# Patient Record
Sex: Female | Born: 1935 | Race: Black or African American | Hispanic: No | Marital: Single
Health system: Southern US, Community
[De-identification: ages and names within clinical notes are randomized; demographics above are authoritative.]

---

## 2004-10-08 ENCOUNTER — Ambulatory Visit: Payer: Self-pay | Admitting: Family Medicine

## 2005-07-12 ENCOUNTER — Other Ambulatory Visit: Payer: Self-pay

## 2005-07-12 ENCOUNTER — Inpatient Hospital Stay: Payer: Self-pay | Admitting: Internal Medicine

## 2005-10-21 ENCOUNTER — Ambulatory Visit: Payer: Self-pay | Admitting: Nurse Practitioner

## 2006-10-27 ENCOUNTER — Ambulatory Visit: Payer: Self-pay | Admitting: Family Medicine

## 2006-11-09 ENCOUNTER — Ambulatory Visit: Payer: Self-pay | Admitting: Family Medicine

## 2006-11-10 ENCOUNTER — Ambulatory Visit: Payer: Self-pay | Admitting: Family Medicine

## 2008-01-09 ENCOUNTER — Ambulatory Visit: Payer: Self-pay | Admitting: Family Medicine

## 2008-07-20 ENCOUNTER — Emergency Department: Payer: Self-pay | Admitting: Emergency Medicine

## 2009-01-17 ENCOUNTER — Ambulatory Visit: Payer: Self-pay | Admitting: Family Medicine

## 2009-01-29 ENCOUNTER — Ambulatory Visit: Payer: Self-pay | Admitting: Family Medicine

## 2009-02-21 ENCOUNTER — Emergency Department: Payer: Self-pay | Admitting: Emergency Medicine

## 2010-01-08 ENCOUNTER — Inpatient Hospital Stay: Payer: Self-pay | Admitting: General Practice

## 2010-01-15 ENCOUNTER — Encounter: Payer: Self-pay | Admitting: Internal Medicine

## 2010-02-11 ENCOUNTER — Encounter: Payer: Self-pay | Admitting: Internal Medicine

## 2012-09-11 ENCOUNTER — Emergency Department: Payer: Self-pay | Admitting: Emergency Medicine

## 2012-09-11 LAB — URINALYSIS, COMPLETE
Bilirubin,UR: NEGATIVE
Blood: NEGATIVE
Glucose,UR: NEGATIVE mg/dL (ref 0–75)
Ketone: NEGATIVE
Ph: 6 (ref 4.5–8.0)
WBC UR: 1 /HPF (ref 0–5)

## 2012-09-11 LAB — CBC
HCT: 38.3 % (ref 35.0–47.0)
HGB: 12.8 g/dL (ref 12.0–16.0)
MCH: 30.4 pg (ref 26.0–34.0)
MCV: 91 fL (ref 80–100)
RDW: 13.4 % (ref 11.5–14.5)
WBC: 7.9 10*3/uL (ref 3.6–11.0)

## 2012-09-11 LAB — COMPREHENSIVE METABOLIC PANEL
Alkaline Phosphatase: 107 U/L (ref 50–136)
Anion Gap: 5 — ABNORMAL LOW (ref 7–16)
Bilirubin,Total: 0.6 mg/dL (ref 0.2–1.0)
Calcium, Total: 9.5 mg/dL (ref 8.5–10.1)
Co2: 27 mmol/L (ref 21–32)
EGFR (African American): 46 — ABNORMAL LOW
EGFR (Non-African Amer.): 39 — ABNORMAL LOW
Osmolality: 275 (ref 275–301)
Potassium: 4.5 mmol/L (ref 3.5–5.1)
SGOT(AST): 19 U/L (ref 15–37)
Total Protein: 7.8 g/dL (ref 6.4–8.2)

## 2012-12-27 ENCOUNTER — Ambulatory Visit: Payer: Self-pay | Admitting: General Surgery

## 2012-12-27 ENCOUNTER — Encounter: Payer: Self-pay | Admitting: Surgery

## 2013-01-01 LAB — WOUND AEROBIC CULTURE

## 2013-01-02 ENCOUNTER — Encounter: Payer: Self-pay | Admitting: Surgery

## 2013-01-11 ENCOUNTER — Encounter: Payer: Self-pay | Admitting: Surgery

## 2013-02-11 ENCOUNTER — Encounter: Payer: Self-pay | Admitting: Surgery

## 2013-03-23 ENCOUNTER — Encounter: Payer: Self-pay | Admitting: Surgery

## 2013-03-31 LAB — WOUND AEROBIC CULTURE

## 2013-04-11 ENCOUNTER — Encounter: Payer: Self-pay | Admitting: Surgery

## 2013-05-11 ENCOUNTER — Encounter: Payer: Self-pay | Admitting: Surgery

## 2013-06-19 ENCOUNTER — Encounter: Payer: Self-pay | Admitting: Surgery

## 2013-07-10 LAB — URINALYSIS, COMPLETE
BACTERIA: NONE SEEN
BLOOD: NEGATIVE
Bilirubin,UR: NEGATIVE
Glucose,UR: NEGATIVE mg/dL (ref 0–75)
Hyaline Cast: 5
LEUKOCYTE ESTERASE: NEGATIVE
NITRITE: NEGATIVE
PH: 5 (ref 4.5–8.0)
Protein: NEGATIVE
RBC,UR: 1 /HPF (ref 0–5)
SPECIFIC GRAVITY: 1.021 (ref 1.003–1.030)
Squamous Epithelial: <1
WBC UR: 1 /HPF (ref 0–5)

## 2013-07-10 LAB — HEPATIC FUNCTION PANEL A (ARMC)
ALBUMIN: 3.5 g/dL (ref 3.4–5.0)
ALT: 12 U/L (ref 12–78)
Alkaline Phosphatase: 105 U/L
BILIRUBIN DIRECT: 0.1 mg/dL (ref 0.00–0.20)
Bilirubin,Total: 0.6 mg/dL (ref 0.2–1.0)
SGOT(AST): 12 U/L — ABNORMAL LOW (ref 15–37)
Total Protein: 8.3 g/dL — ABNORMAL HIGH (ref 6.4–8.2)

## 2013-07-10 LAB — CBC
HCT: 40.8 % (ref 35.0–47.0)
HGB: 13 g/dL (ref 12.0–16.0)
MCH: 28.6 pg (ref 26.0–34.0)
MCHC: 31.9 g/dL — AB (ref 32.0–36.0)
MCV: 90 fL (ref 80–100)
PLATELETS: 300 10*3/uL (ref 150–440)
RBC: 4.54 10*6/uL (ref 3.80–5.20)
RDW: 14.2 % (ref 11.5–14.5)
WBC: 7.7 10*3/uL (ref 3.6–11.0)

## 2013-07-10 LAB — BASIC METABOLIC PANEL
Anion Gap: 10 (ref 7–16)
BUN: 14 mg/dL (ref 7–18)
CO2: 26 mmol/L (ref 21–32)
Calcium, Total: 9.8 mg/dL (ref 8.5–10.1)
Chloride: 103 mmol/L (ref 98–107)
Creatinine: 1.1 mg/dL (ref 0.60–1.30)
EGFR (African American): 54 — ABNORMAL LOW
GFR CALC NON AF AMER: 46 — AB
GLUCOSE: 109 mg/dL — AB (ref 65–99)
OSMOLALITY: 279 (ref 275–301)
Potassium: 4.5 mmol/L (ref 3.5–5.1)
SODIUM: 139 mmol/L (ref 136–145)

## 2013-07-10 LAB — LIPASE, BLOOD: Lipase: 145 U/L (ref 73–393)

## 2013-07-10 LAB — MAGNESIUM: MAGNESIUM: 1.8 mg/dL

## 2013-07-10 LAB — TROPONIN I: Troponin-I: <0.02 ng/mL

## 2013-07-11 ENCOUNTER — Observation Stay: Payer: Self-pay | Admitting: Internal Medicine

## 2013-07-12 LAB — CBC WITH DIFFERENTIAL/PLATELET
BASOS PCT: 1 %
Basophil #: 0.1 10*3/uL (ref 0.0–0.1)
EOS ABS: 0.2 10*3/uL (ref 0.0–0.7)
EOS PCT: 3 %
HCT: 36.6 % (ref 35.0–47.0)
HGB: 12.2 g/dL (ref 12.0–16.0)
LYMPHS PCT: 19.2 %
Lymphocyte #: 1.4 10*3/uL (ref 1.0–3.6)
MCH: 29.7 pg (ref 26.0–34.0)
MCHC: 33.2 g/dL (ref 32.0–36.0)
MCV: 89 fL (ref 80–100)
MONO ABS: 0.6 x10 3/mm (ref 0.2–0.9)
MONOS PCT: 8.6 %
NEUTROS ABS: 4.9 10*3/uL (ref 1.4–6.5)
Neutrophil %: 68.2 %
Platelet: 280 10*3/uL (ref 150–440)
RBC: 4.1 10*6/uL (ref 3.80–5.20)
RDW: 13.9 % (ref 11.5–14.5)
WBC: 7.2 10*3/uL (ref 3.6–11.0)

## 2013-07-12 LAB — BASIC METABOLIC PANEL
Anion Gap: 7 (ref 7–16)
BUN: 12 mg/dL (ref 7–18)
Calcium, Total: 9.4 mg/dL (ref 8.5–10.1)
Chloride: 106 mmol/L (ref 98–107)
Co2: 25 mmol/L (ref 21–32)
Creatinine: 1.03 mg/dL (ref 0.60–1.30)
EGFR (African American): 58 — ABNORMAL LOW
EGFR (Non-African Amer.): 50 — ABNORMAL LOW
GLUCOSE: 114 mg/dL — AB (ref 65–99)
Osmolality: 276 (ref 275–301)
Potassium: 4.3 mmol/L (ref 3.5–5.1)
Sodium: 138 mmol/L (ref 136–145)

## 2013-07-16 LAB — CEA: CEA: 1.1 ng/mL (ref 0.0–4.7)

## 2013-07-16 LAB — CANCER ANTIGEN 27.29: CA 27.29: 32.4 U/mL (ref 0.0–38.6)

## 2013-07-17 ENCOUNTER — Ambulatory Visit: Payer: Self-pay | Admitting: Internal Medicine

## 2013-07-17 LAB — PROTIME-INR
INR: 1.1
Prothrombin Time: 13.8 secs (ref 11.5–14.7)

## 2013-07-17 LAB — CREATININE, SERUM
Creatinine: 1.22 mg/dL (ref 0.60–1.30)
GFR CALC AF AMER: 47 — AB
GFR CALC NON AF AMER: 41 — AB

## 2013-07-18 LAB — CA 125: CA 125: 2049 U/mL — ABNORMAL HIGH (ref 0.0–34.0)

## 2013-07-18 LAB — CANCER ANTIGEN 19-9: CA 19-9: <1 U/mL (ref 0–35)

## 2013-07-23 LAB — BASIC METABOLIC PANEL
Anion Gap: 6 — ABNORMAL LOW (ref 7–16)
BUN: 13 mg/dL (ref 7–18)
CHLORIDE: 103 mmol/L (ref 98–107)
CREATININE: 1.36 mg/dL — AB (ref 0.60–1.30)
Calcium, Total: 9.7 mg/dL (ref 8.5–10.1)
Co2: 26 mmol/L (ref 21–32)
EGFR (African American): 42 — ABNORMAL LOW
EGFR (Non-African Amer.): 36 — ABNORMAL LOW
Glucose: 159 mg/dL — ABNORMAL HIGH (ref 65–99)
Osmolality: 274 (ref 275–301)
POTASSIUM: 4.6 mmol/L (ref 3.5–5.1)
Sodium: 135 mmol/L — ABNORMAL LOW (ref 136–145)

## 2013-07-23 LAB — APTT: ACTIVATED PTT: 27.5 s (ref 23.6–35.9)

## 2013-08-11 ENCOUNTER — Ambulatory Visit: Payer: Self-pay | Admitting: Internal Medicine

## 2013-09-11 DEATH — deceased

## 2014-05-04 NOTE — Consult Note (Signed)
Brief Consult Note: Diagnosis: metastatic cancer unknown primary site.   Comments: patient seen, chart reviewed, finding of sclerotic metastasis , no lung mass, small nonspecific pulm nodules, agree that breast or metastatic renal cancer most consistent with this presentation. Will check first u/s abdomen, if no obvious primary site, or if no obvious liver metastasis for bx, would obtain additional imaging. Will check also cea and ca 27/29 tumor markers. Dictated note to follow.  Electronic Signatures: Marin RobertsGittin, Luisana Lutzke G (MD)  (Signed 01-Jul-15 19:53)  Authored: Brief Consult Note   Last Updated: 01-Jul-15 19:53 by Marin RobertsGittin, Trygg Mantz G (MD)

## 2014-05-04 NOTE — H&P (Signed)
PATIENT NAME:  Cheryl Padilla, Cheryl Padilla MR#:  [id number] DATE OF BIRTH:  08-Jun-1928  DATE OF ADMISSION:  07/11/2013  PRIMARY CARE PHYSICIAN: Dr. Darreld McleanLinda Padilla.   REFERRING PHYSICIAN: Dr. Bayard Malesandolph Brown.    CHIEF COMPLAINT: Back pain.   HISTORY OF PRESENT ILLNESS: Ms. Cheryl Padilla is an 81 year old female with history of osteoarthritis, hypertension and hyperlipidemia. Comes to the Emergency Department with complaints of pain throughout the spine, 10/10 intensity. The patient also states has been having pain in her chest. The patient is an extremely poor historian and very hard of hearing. Unable to obtain the history from the patient. The patient's family members are not present at this time. Concerning these symptoms, the patient underwent a CT of the chest which showed multiple sclerotic lesions of the axial and appendicular skeleton highly concerning about metastatic disease. Concerning these lesions and the patient's pain, the decision is made to admit the patient for further workup. As mentioned above, it is extremely hard to obtain any history from the patient. Uncertain if the patient has been having any fever or losing weight.   PAST MEDICAL HISTORY:  1. Venous stasis.  2. Peripheral neuropathy.  3. Hard of hearing.  4. Hypertension.  5. Renal insufficiency.  6. Osteoarthritis.  7. Hypertension.  8. Diabetes mellitus type 2.   PAST SURGICAL HISTORY:  1. Hip surgery.  2. Polypectomy.   ALLERGIES: No known drug allergies.   HOME MEDICATIONS:  1. Tramadol 50 mg every 6 hours as needed.  2. Spironolactone 25 mg once a day.  3. Aspirin 81 mg daily.   SOCIAL HISTORY: As per previous records, no history of smoking, drinking alcohol or using illicit drugs   FAMILY HISTORY: Diabetes mellitus and kidney disease.   REVIEW OF SYSTEMS: Could not be obtained as the patient is very hard of hearing and has some level of confusion.   PHYSICAL EXAMINATION:  GENERAL: This is a well-built, well-nourished,  age-appropriate female lying down in the bed, not in distress.  VITAL SIGNS: Temperature 98.2, pulse 87, blood pressure 153/72, respiratory rate of 14, oxygen saturation is 96% on room air.  HEENT: Head normocephalic, atraumatic. There is no scleral icterus. Conjunctivae normal. Pupils equal and reactive. Extraocular movements are intact. Mucous membranes moist. No pharyngeal erythema.  NECK: Supple. No lymphadenopathy. No JVD. No carotid bruit.  CHEST: Has no focal tenderness. Mild coarse breath sounds bilaterally.  SPINE: Has mild tenderness throughout the spine.  HEART: S1, S2 regular. No murmurs are heard.  ABDOMEN: Bowel sounds present. Soft, nontender, nondistended.  EXTREMITIES: No pedal edema. Pulses 2+.  NEUROLOGIC: The patient is oriented to self. Could not tell if the patient is oriented to place, person and time as the patient is extremely hard of hearing.   LABORATORIES: UA negative for nitrites and leukocyte esterase.   Chest x-ray shows no active findings. Possible pulmonary nodule. Recommend CT chest.   Magnesium 1.8. Lipase 145. LFTs are within normal limits. CBC is completely within normal limits.   Troponin less than 0.02.   ASSESSMENT AND PLAN: Ms. Cheryl Padilla is an 81 year old female who comes to the Emergency Department with back pain. She is found to have multiple sclerotic lesions.  1. Vertebral lesions: Will need further workup with biopsy of the bone. The primary could be breast or renal. Will consult oncology.  2. Hypertension: Currently well controlled. Continue with home medications.  3. Diabetes mellitus: Currently not on any medications. Keep the patient on sliding scale insulin.  4. Debility: Will involve  physical therapy.  5. Keep the patient on deep vein thrombosis prophylaxis with Lovenox.   TIME SPENT: 45 minutes.   ____________________________ Susa Griffins, MD pv:gb D: 07/11/2013 02:13:47 ET T: 07/11/2013 02:40:47 ET JOB#: [id number]  cc: Susa Griffins, MD, <Dictator> Leanna Sato, MD Susa Griffins MD ELECTRONICALLY SIGNED 07/19/2013 0:26

## 2014-05-04 NOTE — Consult Note (Signed)
PATIENT NAME:  Cheryl Padilla, [name] MR#:  [id number] DATE OF BIRTH:  1928-02-28  DATE OF CONSULTATION:  07/11/2013  REFERRING PHYSICIAN:   CONSULTING PHYSICIAN:  Knute Neuobert G. Lorre NickGittin, MD  HISTORY OF PRESENT ILLNESS: Cheryl Padilla is an 81 year old patient who was admitted on July 1st and I consulted on July 1st, a note was placed on the chart, interviewed the patient and the family, recommended additional studies, discussed with attending physician, but this narrative was delayed until currently. The patient came to the Emergency Room with chest pain. CT of the chest ruled out any intrathoracic pathology or pulmonary embolism, but showed sclerotic lesions in the thoracic and cervical spine and a questionable sclerotic lesion in the humeral head highly suspicious for metastatic cancer. The patient was given pain medicine. She was quickly improved. She was home the next day. She had an ultrasound of the abdomen that was also negative for any pathology in the liver, spleen or kidneys. Tumor markers were also done. The patient was discharged to [facility] up in [facility].   PAST MEDICAL HISTORY: The patient's primary care physician is Dr. Darreld McleanLinda Miles. Has a history of venous stasis, peripheral neuropathy, hard of hearing, hypertension, renal insufficiency, osteoarthritis, hypertension and diabetes.  PAST SURGICAL HISTORY: Includes hip surgery and polyp removal.   ALLERGIES: No known drug allergies.   FAMILY HISTORY: Negative for malignancy.   MEDICATIONS: At home the patient was on tramadol, spironolactone and aspirin and went home on the same medicines.   SOCIAL HISTORY: No alcohol. No tobacco.   REVIEW OF SYSTEMS: Full review was difficult as the patient was hard of hearing, is mildly confused at times, but was able to relate that she was not having headache, no shortness of breath, was not having abdominal pain. Her back pain was better when I saw her. Appetite was unremarkable.   PHYSICAL EXAMINATION:   GENERAL: Was alert.  EYES: Sclerae were clear.  MOUTH: No thrush. LYMPH: No lymph nodes in the neck, supraclavicular, submandibular, or axilla.  LUNGS: Clear with no wheezing or rales. HEART: Regular.  ABDOMEN: Obese, nontender. No palpable mass or organomegaly.  EXTREMITIES: No edema.  NEUROLOGIC: Grossly nonfocal.  DIAGNOSTIC DATA: Urinalysis was unremarkable.   Chest x-ray was clear. Chest CT showed small 3 and 5 mm pulmonary nodules, nonspecific in size or criteria.   Liver functions were normal. Creatinine was normal. Magnesium was normal. CBC was normal. CT was already described. Later tumor markers, CEA was normal as was the CA 27-29.   IMPRESSION AND PLAN: The patient highly suspicious for malignancy and less is nonmalignant sclerotic process. Plan to follow the patient as an outpatient, looking at possibly biopsying the bone or first getting CT with contrast of the abdomen and possibly a bone scan. All the work-up appropriate as an outpatient. Also mammogram needs to be documented.   ____________________________ Knute Neuobert G. Lorre NickGittin, MD rgg:sb D: 07/17/2013 11:24:41 ET T: 07/17/2013 11:47:04 ET JOB#: [id number]  cc: Knute Neuobert G. Lorre NickGittin, MD, <Dictator> Marin RobertsOBERT G GITTIN MD ELECTRONICALLY SIGNED 07/26/2013 13:11

## 2014-05-04 NOTE — Consult Note (Signed)
PATIENT NAME:  Cheryl Padilla, Cheryl Padilla MR#:  [id number] DATE OF BIRTH:  12-19-1928  DATE OF CONSULTATION:  07/11/2013  REFERRING PHYSICIAN:   CONSULTING PHYSICIAN:  Simonne Come. Inez Pilgrim, MD  HISTORY:  Mrs. Behrman is an 81 year old patient who was admitted on 07/01 and I consulted on that day, discussed with attending physician, placed a note on the chart and met with the family. Recommended follow-up studies, but this full narrative was delayed until currently. The patient came in with pain in the chest that was chest and back, and the CT scan ruled out PE and ruled out any lung pathology which showed multiple sclerotic lesions of the axial and appendicular skeleton that was highly concerning for metastatic disease.  DICTATION ENDS HERE    ____________________________ Simonne Come. Inez Pilgrim, MD rgg:ts D: 07/17/2013 11:18:54 ET T: 07/17/2013 12:23:52 ET JOB#: [id number]  cc: Simonne Come. Inez Pilgrim, MD, <Dictator> Dallas Schimke MD ELECTRONICALLY SIGNED 07/26/2013 13:11

## 2014-05-04 NOTE — Discharge Summary (Signed)
PATIENT NAME:  Cheryl Padilla, Cheryl Padilla MR#:  [id number] DATE OF BIRTH:  23-Mar-1928  DATE OF ADMISSION:  07/11/2013 DATE OF DISCHARGE:  07/12/2013  DISCHARGE DIAGNOSES: 1. Low back pain and headache. 2. Metastatic disease of unknown primary.   DISCHARGE MEDICATIONS:  1. Aspirin 81 mg daily.  2. Aldactone 25 mg p.o. daily. 3. Tramadol 50 mg every 6 hours as needed pain.   CONSULTATIONS: Oncology consult with Dr. Inez Pilgrim.   HOSPITAL COURSE: This is an 81 year old female who is very hard of hearing, came in because she had headache and back pain and patient's family brought her here for further evaluation. In the ER, her CT angiography chest showed no pulmonary emboli but had sclerotic lesions with included axial and appendicular skeleton highly concerning for metastatic disease. The patient placed on observation status and she received some fluids, pain medication and the patient was seen by Dr. Inez Pilgrim in consultation. He ordered ultrasound of abdomen to see if she had any renal cancer. The patient's ultrasound of abdomen was negative and there was a concern about possible breast cancer and she will have a workup with Dr. Inez Pilgrim as an outpatient. Her serum marke 27.29 was normal , CBC MET-B were within normal limits. I discussed with Dr. Inez Pilgrim. He said patient can go home and see him in the office on Tuesday for further workup. I discussed this with patient's son as well.   DISCHARGE VITAL SIGNS: Temperature 97.5, heart rate 108, blood pressure 120/78, saturation 92% on room air.   TIME SPENT ON DISCHARGE PREPARATION: More than 30 minutes.    ____________________________ Epifanio Lesches, MD sk:lt/am D: 07/13/2013 12:15:56 ET T: 07/13/2013 23:22:33 ET JOB#: [id number]  cc: Epifanio Lesches, MD, <Dictator> Simonne Come. Inez Pilgrim, MD Marguerita Merles, MD   Epifanio Lesches MD ELECTRONICALLY SIGNED 07/26/2013 19:51

## 2014-10-10 IMAGING — US ABDOMEN ULTRASOUND
1 series · 14 of 25 positions shown · non-contrast
Comparison: None.

CLINICAL DATA: Metastatic cancer.

EXAM:
ULTRASOUND ABDOMEN COMPLETE

[Series 1: abdomen ultrasound · 0.25mm/px · 14 of 92 slices shown]
[im 1/92]
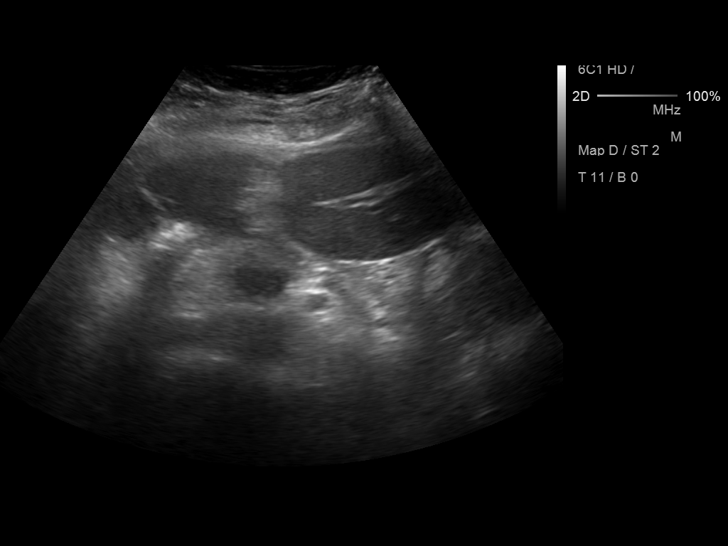
[im 8/92]
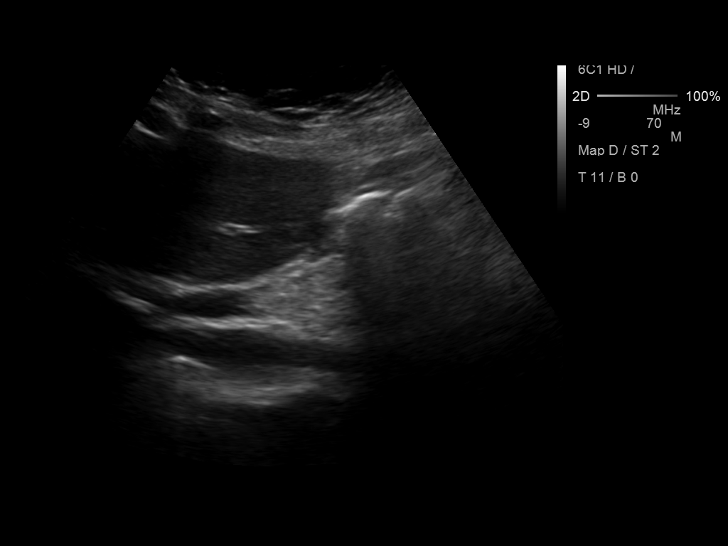
[im 16/92]
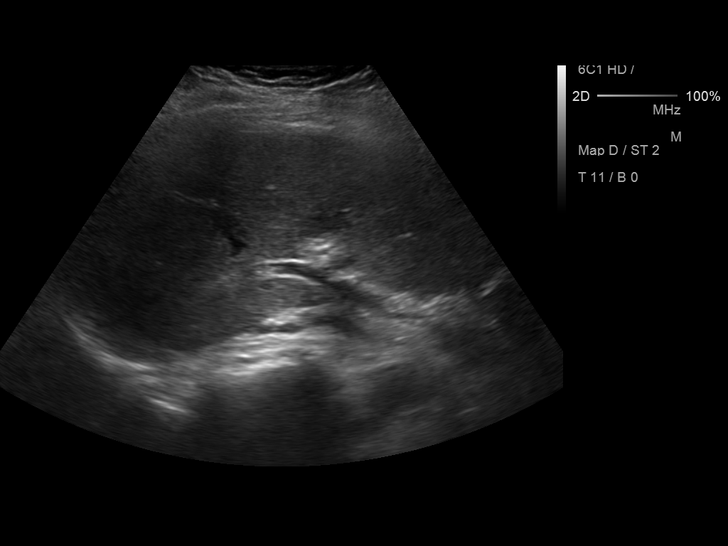
[im 23/92]
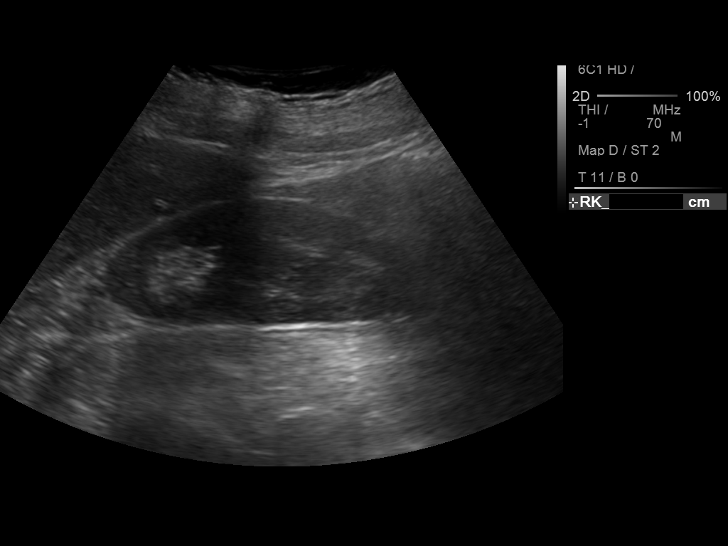
[im 31/92]
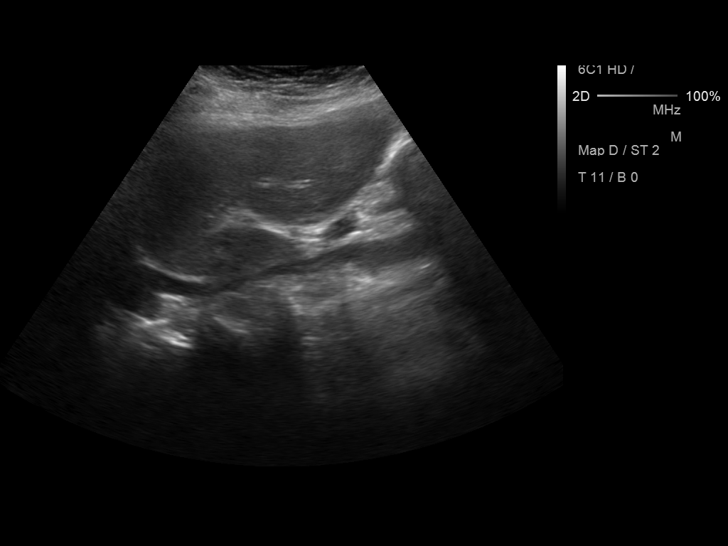
[im 35/92]
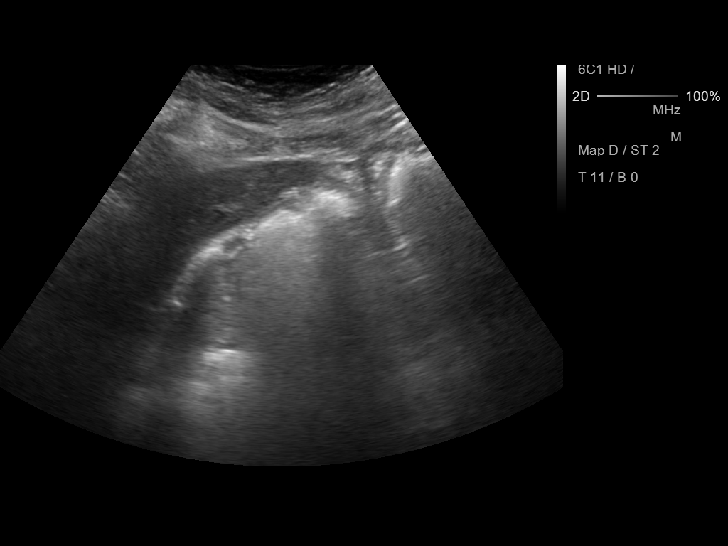
[im 42/92]
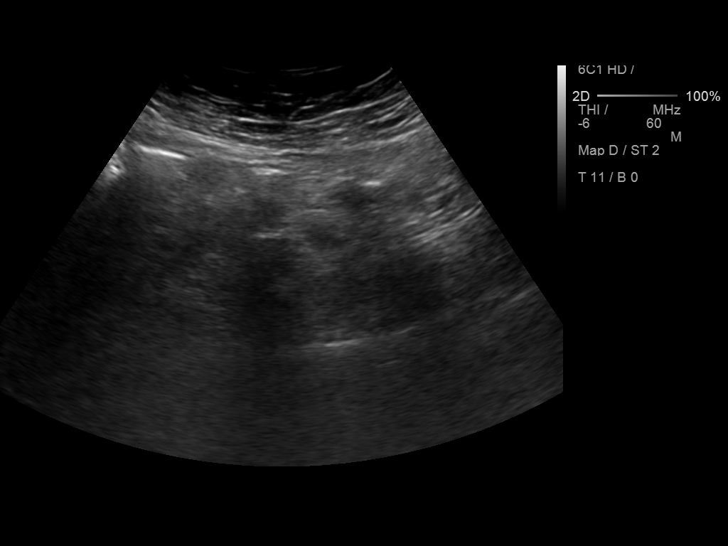
[im 50/92]
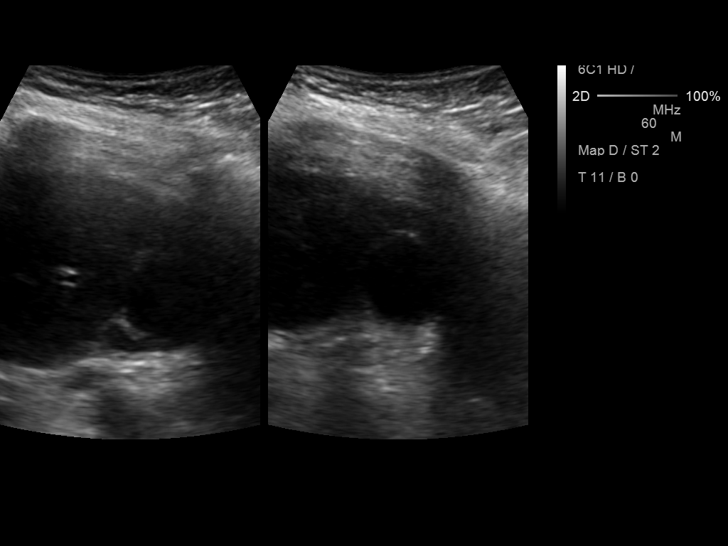
[im 57/92]
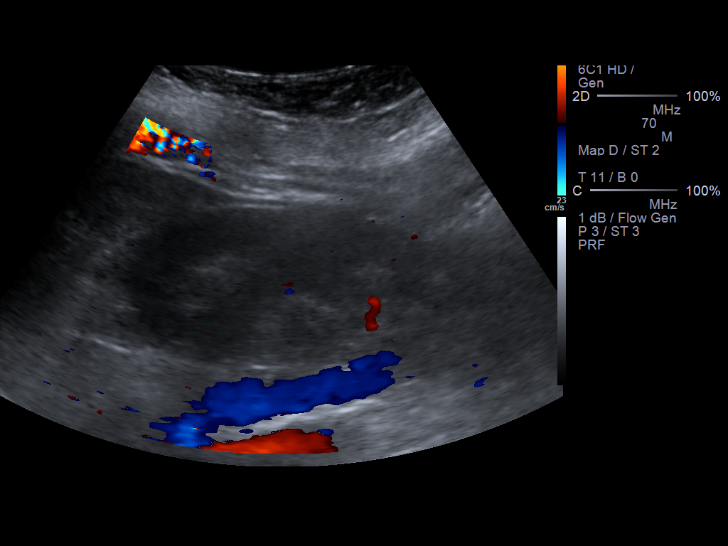
[im 61/92]
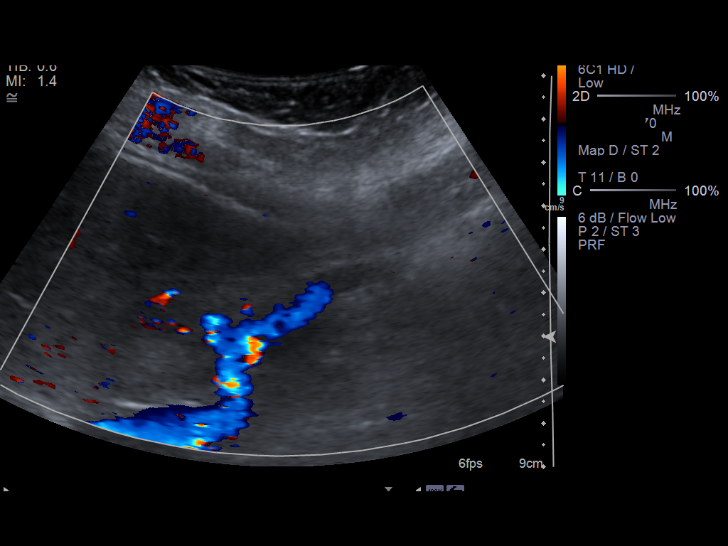
[im 69/92]
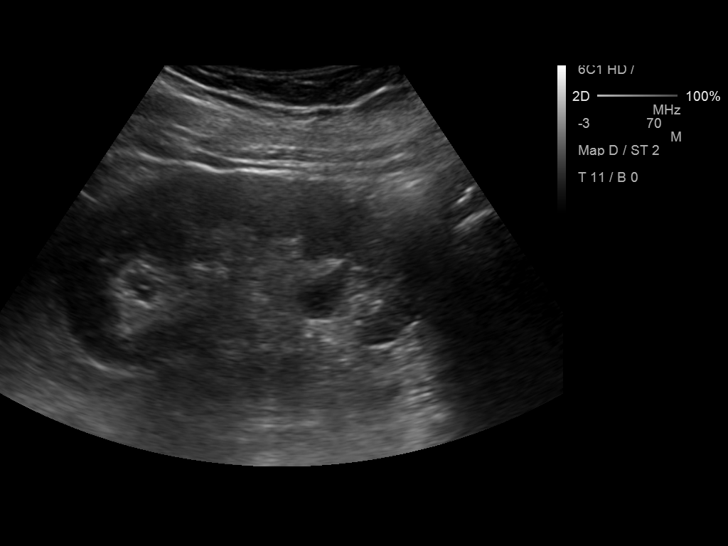
[im 76/92]
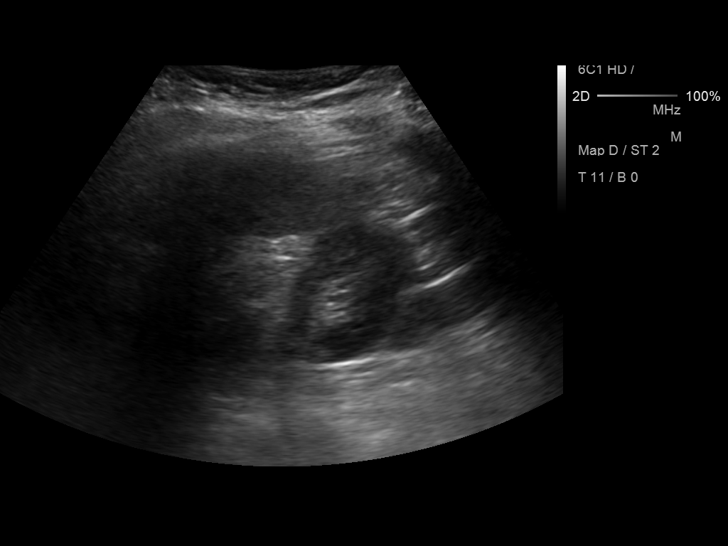
[im 84/92]
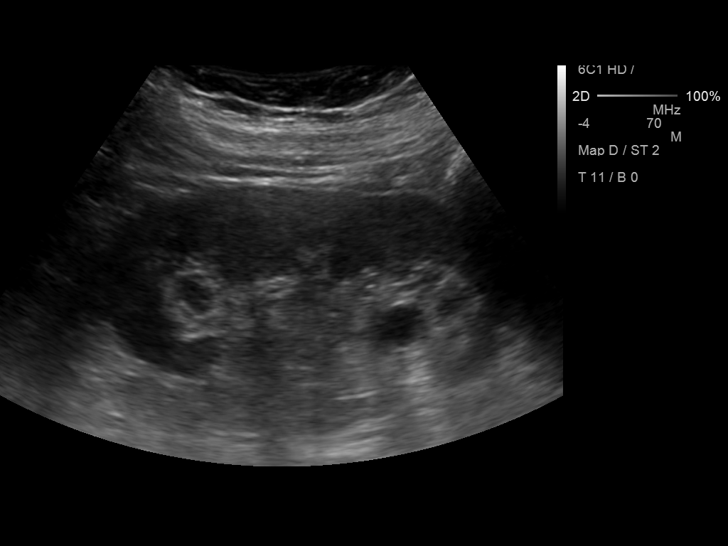
[im 92/92]
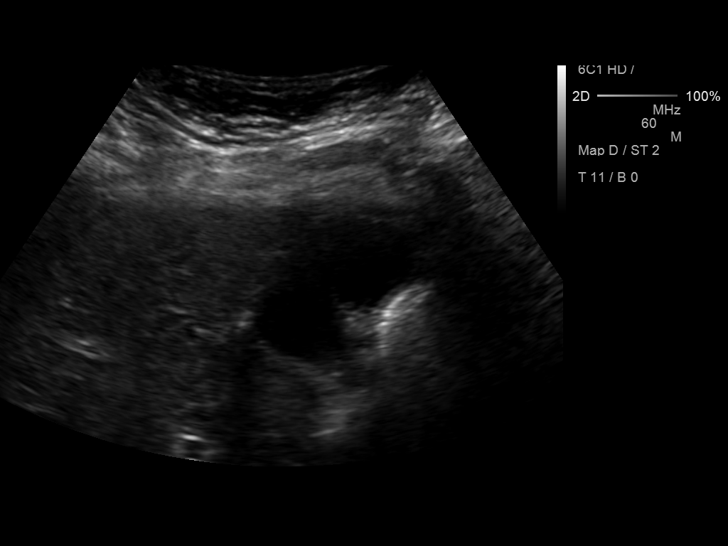

[14 of 25 positions shown; findings below may reference images not displayed]

FINDINGS: Gallbladder:

No gallstones or wall thickening visualized. No sonographic Murphy
sign noted.

Common bile duct:

Diameter: Measures 5.7 mm which is within normal limits.

Liver:

No focal lesion identified. Within normal limits in parenchymal
echogenicity.

IVC:

No abnormality visualized.

Pancreas:

Visualized portion unremarkable.

Spleen:

Size and appearance within normal limits.

Right Kidney:

Length: 10.2 cm. Echogenicity within normal limits. No mass or
hydronephrosis visualized.

Left Kidney:

Length: 10.3 cm. Echogenicity within normal limits. Probable
parapelvic cysts are noted. No mass or hydronephrosis visualized.

Abdominal aorta:

No aneurysm visualized.

Other findings:

None.
IMPRESSION: Probable parapelvic cysts seen in left kidney. No other abnormality
seen in the abdomen.
